# Patient Record
Sex: Female | Born: 1995 | Race: White | Hispanic: No | Marital: Single | Smoking: Never smoker
Health system: Southern US, Community
[De-identification: ages and names within clinical notes are randomized; demographics above are authoritative.]

## PROBLEM LIST (undated history)

## (undated) HISTORY — PX: FRACTURE SURGERY: SHX138

## (undated) HISTORY — PX: TONSILLECTOMY: SUR1361

---

## 2018-12-15 ENCOUNTER — Encounter (HOSPITAL_COMMUNITY): Payer: Self-pay | Admitting: Emergency Medicine

## 2018-12-15 ENCOUNTER — Emergency Department (HOSPITAL_COMMUNITY): Payer: Self-pay

## 2018-12-15 ENCOUNTER — Other Ambulatory Visit: Payer: Self-pay

## 2018-12-15 ENCOUNTER — Emergency Department (HOSPITAL_COMMUNITY)
Admission: EM | Admit: 2018-12-15 | Discharge: 2018-12-15 | Disposition: A | Discharge status: Home / Self Care | Payer: Self-pay | Attending: Emergency Medicine | Admitting: Emergency Medicine

## 2018-12-15 DIAGNOSIS — Y9389 Activity, other specified: Secondary | ICD-10-CM | POA: Insufficient documentation

## 2018-12-15 DIAGNOSIS — S060X1A Concussion with loss of consciousness of 30 minutes or less, initial encounter: Secondary | ICD-10-CM | POA: Insufficient documentation

## 2018-12-15 DIAGNOSIS — S8391XA Sprain of unspecified site of right knee, initial encounter: Secondary | ICD-10-CM | POA: Insufficient documentation

## 2018-12-15 DIAGNOSIS — Y998 Other external cause status: Secondary | ICD-10-CM | POA: Insufficient documentation

## 2018-12-15 DIAGNOSIS — Y92410 Unspecified street and highway as the place of occurrence of the external cause: Secondary | ICD-10-CM | POA: Insufficient documentation

## 2018-12-15 MED ORDER — IBUPROFEN 800 MG PO TABS
800.0000 mg | ORAL_TABLET | Freq: Once | ORAL | Status: AC
  Administered 2018-12-15: 800 mg via ORAL
  Filled 2018-12-15: qty 1

## 2018-12-15 MED ORDER — ACETAMINOPHEN 500 MG PO TABS
1000.0000 mg | ORAL_TABLET | Freq: Once | ORAL | Status: AC
  Administered 2018-12-15: 1000 mg via ORAL
  Filled 2018-12-15: qty 2

## 2018-12-15 MED ORDER — CYCLOBENZAPRINE HCL 10 MG PO TABS: 10.0000 mg | ORAL_TABLET | Freq: Two times a day (BID) | ORAL | 0 refills | Status: DC | PRN
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
## 2018-12-20 ENCOUNTER — Other Ambulatory Visit: Payer: Self-pay

## 2018-12-20 ENCOUNTER — Encounter (HOSPITAL_COMMUNITY): Payer: Self-pay | Admitting: Emergency Medicine

## 2018-12-20 ENCOUNTER — Emergency Department (HOSPITAL_COMMUNITY): Payer: Self-pay

## 2018-12-20 ENCOUNTER — Emergency Department (HOSPITAL_COMMUNITY)
Admission: EM | Admit: 2018-12-20 | Discharge: 2018-12-20 | Disposition: A | Discharge status: Home / Self Care | Payer: Self-pay | Attending: Emergency Medicine | Admitting: Emergency Medicine

## 2018-12-20 DIAGNOSIS — S8012XD Contusion of left lower leg, subsequent encounter: Secondary | ICD-10-CM | POA: Insufficient documentation

## 2018-12-20 DIAGNOSIS — S0990XD Unspecified injury of head, subsequent encounter: Secondary | ICD-10-CM | POA: Insufficient documentation

## 2018-12-20 DIAGNOSIS — S8011XD Contusion of right lower leg, subsequent encounter: Secondary | ICD-10-CM | POA: Insufficient documentation

## 2018-12-20 DIAGNOSIS — S060X1D Concussion with loss of consciousness of 30 minutes or less, subsequent encounter: Secondary | ICD-10-CM | POA: Insufficient documentation

## 2018-12-20 DIAGNOSIS — G44309 Post-traumatic headache, unspecified, not intractable: Secondary | ICD-10-CM | POA: Insufficient documentation

## 2018-12-20 DIAGNOSIS — M79671 Pain in right foot: Secondary | ICD-10-CM | POA: Insufficient documentation

## 2018-12-20 NOTE — Discharge Instructions (Addendum)
Take ibuprofen 3 times a day with meals.  Do not take other anti-inflammatories at the same time (Advil, Motrin, naproxen, Aleve). You may supplement with Tylenol if you need further pain control. Continue taking muscle relaxer as prescribed.  Use ice packs or heating pads if this helps control your pain. Follow up with the doctor listed below if you are having continued headaches and/or continued concussion symptoms.  Return to the emergency room if you develop vision changes, vomiting, slurred speech, numbness, loss of bowel or bladder control, or any new or worsening symptoms.
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
## 2019-08-10 ENCOUNTER — Emergency Department (HOSPITAL_COMMUNITY): Payer: Self-pay

## 2019-08-10 ENCOUNTER — Encounter: Payer: Self-pay | Admitting: Emergency Medicine

## 2019-08-10 ENCOUNTER — Emergency Department (HOSPITAL_COMMUNITY)
Admission: EM | Admit: 2019-08-10 | Discharge: 2019-08-11 | Disposition: A | Discharge status: Home / Self Care | Payer: Self-pay | Attending: Emergency Medicine | Admitting: Emergency Medicine

## 2019-08-10 DIAGNOSIS — Y939 Activity, unspecified: Secondary | ICD-10-CM | POA: Insufficient documentation

## 2019-08-10 DIAGNOSIS — Y92019 Unspecified place in single-family (private) house as the place of occurrence of the external cause: Secondary | ICD-10-CM | POA: Insufficient documentation

## 2019-08-10 DIAGNOSIS — Y999 Unspecified external cause status: Secondary | ICD-10-CM | POA: Insufficient documentation

## 2019-08-10 DIAGNOSIS — S93402A Sprain of unspecified ligament of left ankle, initial encounter: Secondary | ICD-10-CM | POA: Insufficient documentation

## 2019-08-10 DIAGNOSIS — S8012XA Contusion of left lower leg, initial encounter: Secondary | ICD-10-CM | POA: Insufficient documentation

## 2019-08-10 DIAGNOSIS — R519 Headache, unspecified: Secondary | ICD-10-CM | POA: Insufficient documentation

## 2019-08-10 DIAGNOSIS — S5011XA Contusion of right forearm, initial encounter: Secondary | ICD-10-CM | POA: Insufficient documentation

## 2019-08-10 DIAGNOSIS — S0003XA Contusion of scalp, initial encounter: Secondary | ICD-10-CM | POA: Insufficient documentation
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
## 2019-08-11 MED ORDER — IBUPROFEN 200 MG PO TABS
400.0000 mg | ORAL_TABLET | Freq: Once | ORAL | Status: AC
  Administered 2019-08-11: 400 mg via ORAL
  Filled 2019-08-11: qty 2

## 2019-08-11 NOTE — Discharge Instructions (Addendum)
Apply ice to all injured areas.  Apply for 30 minutes at a time, 3-4 times a day.  Use the crutches and ankle splint orthotic as needed.  Take acetaminophen and/or ibuprofen as needed for pain.  If any signs of a significant head injury occur, return immediately to get a CT scan of your head.
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
## 2020-12-08 IMAGING — CR DG ANKLE COMPLETE 3+V*L*
3 series · 3 of 3 positions shown · non-contrast
Comparison: None.

CLINICAL DATA: Pain and swelling, lateral bruising, twisted ankle

EXAM:
LEFT ANKLE COMPLETE - 3+ VIEW

[x ankle ap left]
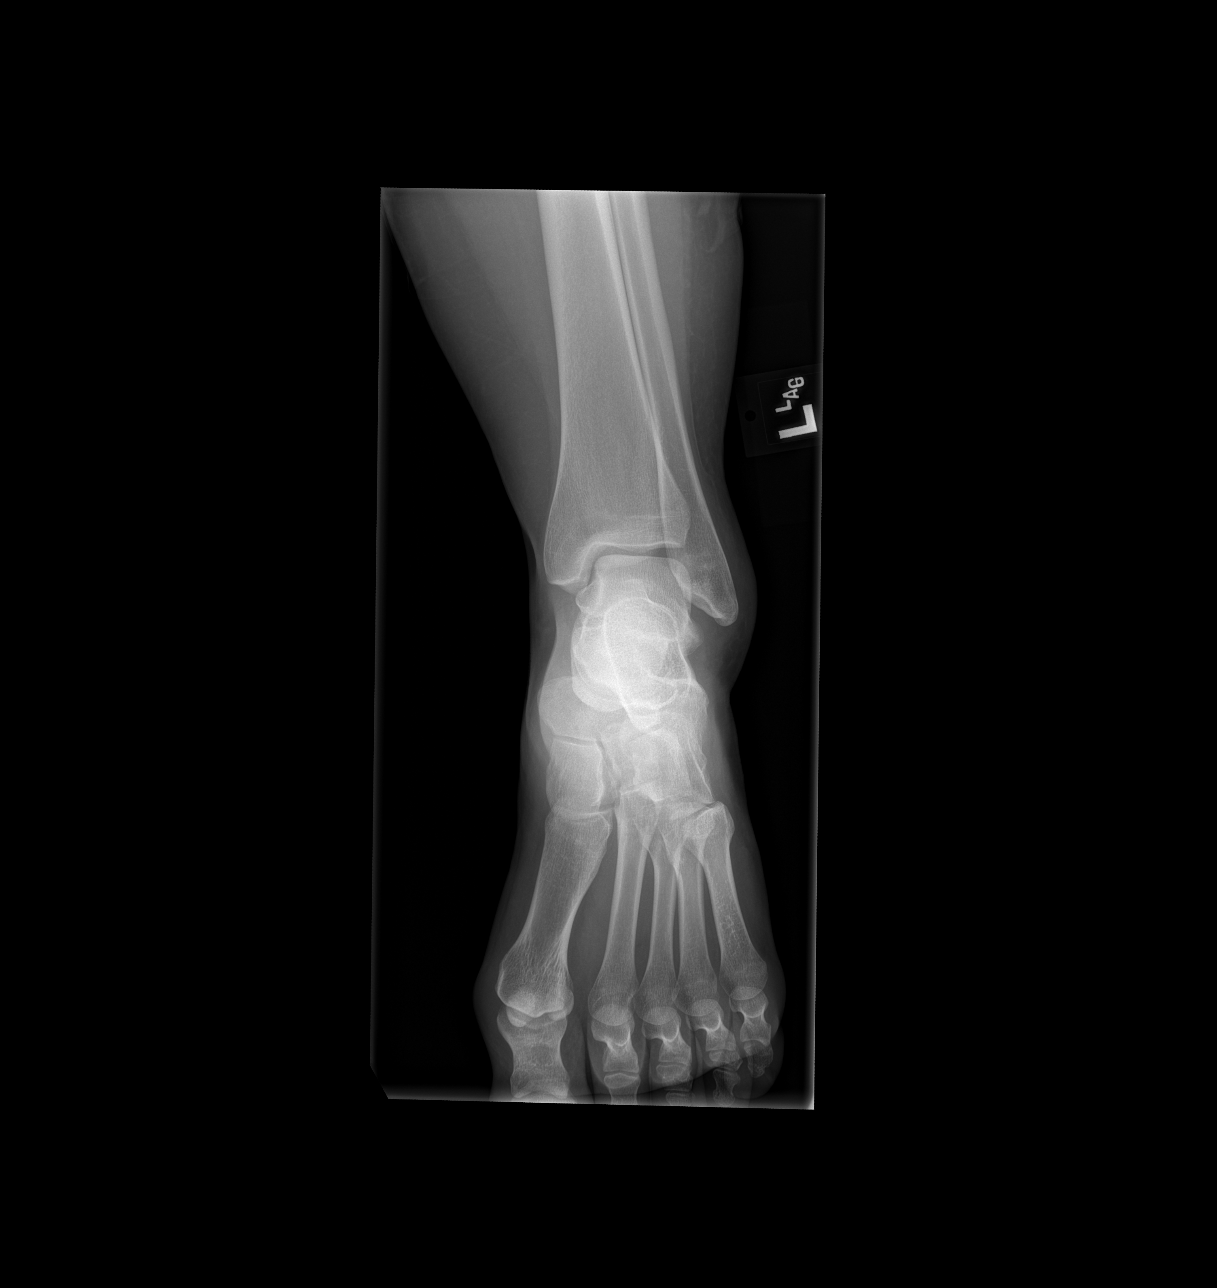

[x ankle obl left]
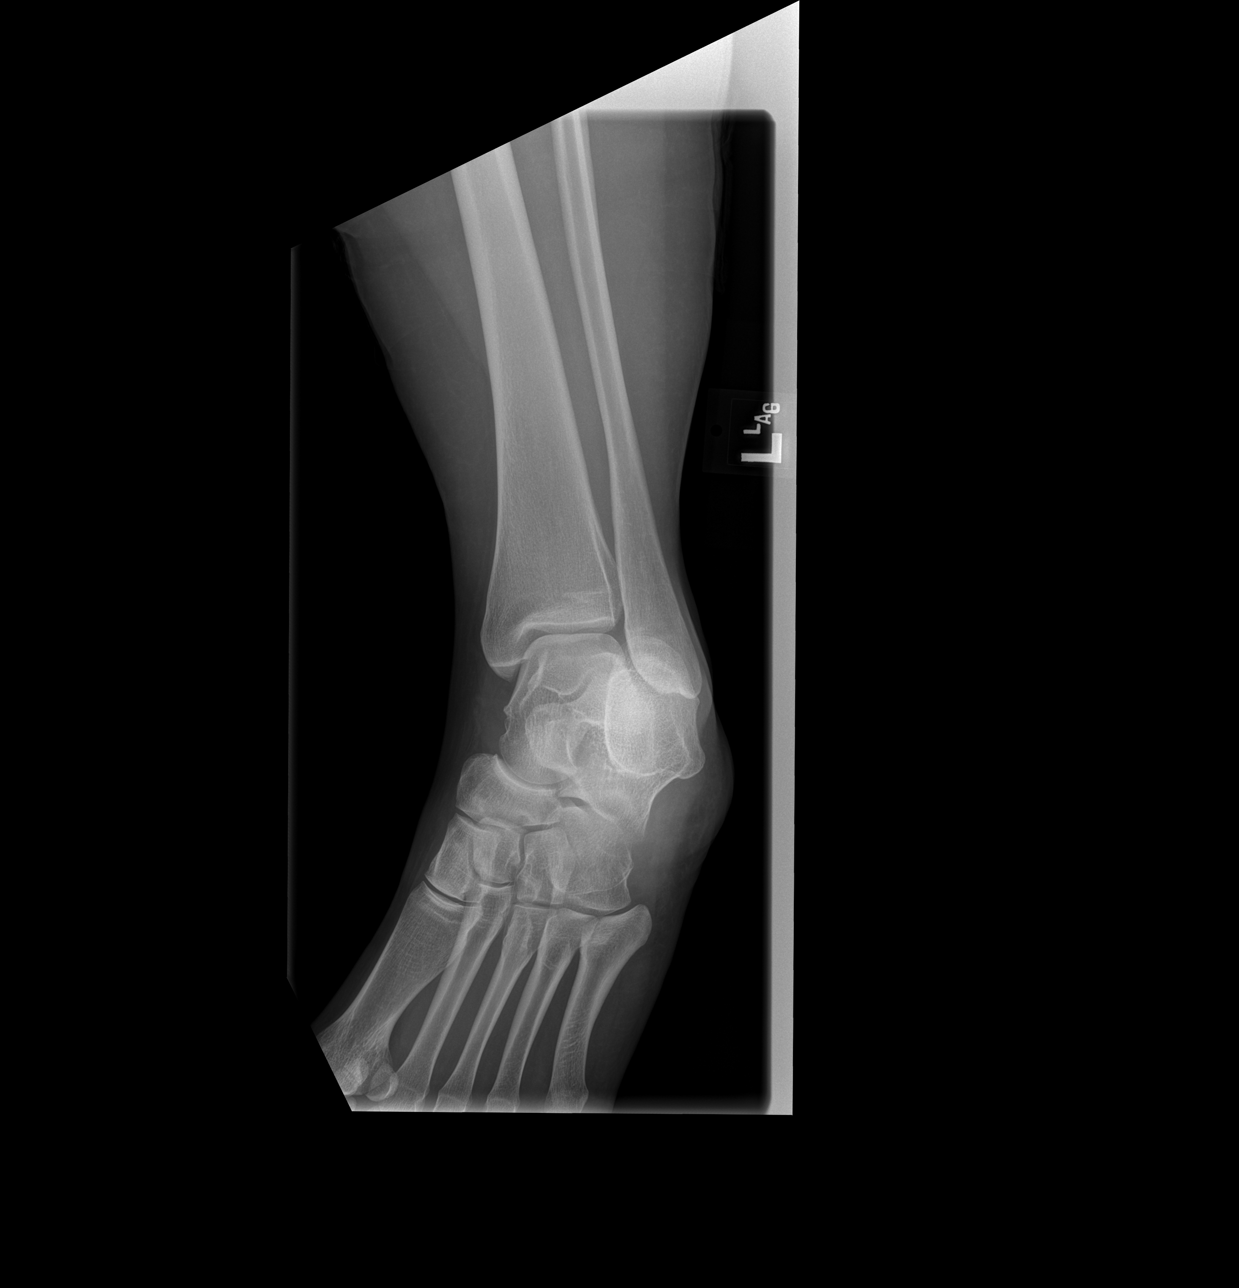

[x ankle lat left]
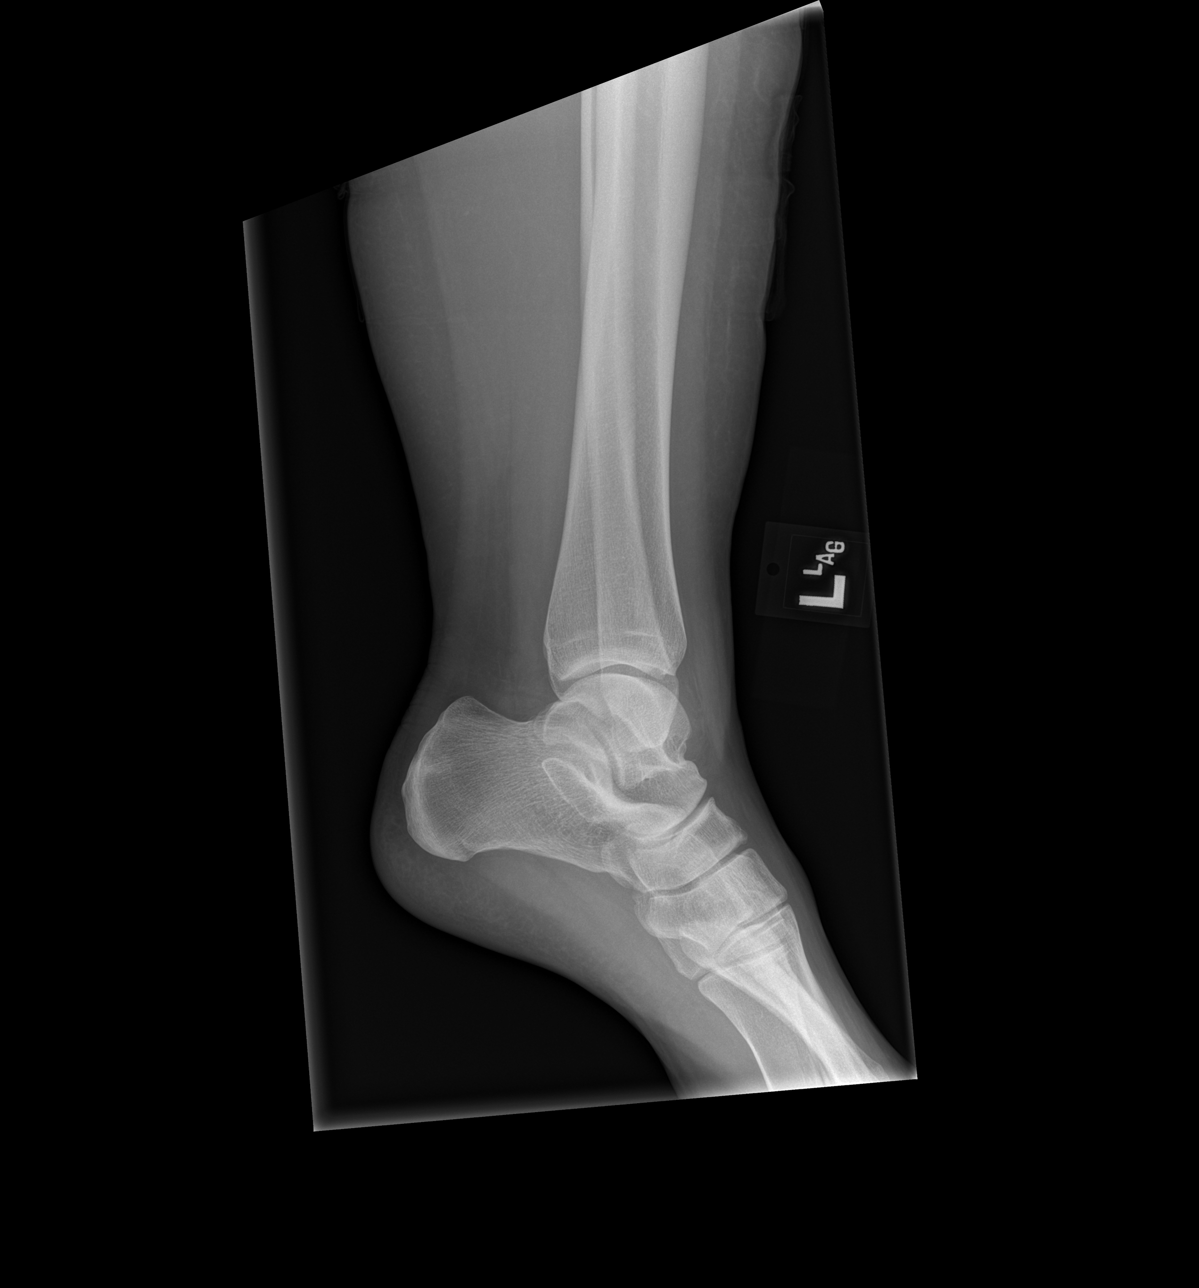

[3 of 3 positions shown; findings below may reference images not displayed]

FINDINGS: Frontal, oblique, and lateral views of the left ankle are obtained.
No fracture, subluxation, or dislocation. Joint spaces are well
preserved. Mild anterior and lateral soft tissue swelling.
IMPRESSION: 1. Soft tissue swelling, no acute fracture.
